# Patient Record
Sex: Male | Born: 1970 | Hispanic: Yes | Marital: Married | Smoking: Never smoker
Health system: Southern US, Community
[De-identification: ages and names within clinical notes are randomized; demographics above are authoritative.]

## PROBLEM LIST (undated history)

## (undated) DIAGNOSIS — E7800 Pure hypercholesterolemia, unspecified: Secondary | ICD-10-CM

## (undated) HISTORY — PX: NO PAST SURGERIES: SHX2092

---

## 2006-07-07 ENCOUNTER — Other Ambulatory Visit: Payer: Self-pay

## 2006-07-07 ENCOUNTER — Emergency Department: Payer: Self-pay | Admitting: Emergency Medicine

## 2007-06-01 ENCOUNTER — Emergency Department: Payer: Self-pay | Admitting: Emergency Medicine

## 2012-02-11 ENCOUNTER — Ambulatory Visit: Payer: Self-pay | Admitting: Emergency Medicine

## 2014-03-17 ENCOUNTER — Ambulatory Visit: Payer: Self-pay | Admitting: Physician Assistant

## 2014-12-13 ENCOUNTER — Encounter: Payer: Self-pay | Admitting: Emergency Medicine

## 2014-12-13 ENCOUNTER — Ambulatory Visit
Admission: EM | Admit: 2014-12-13 | Discharge: 2014-12-13 | Disposition: A | Discharge status: Home / Self Care | Payer: Self-pay | Attending: Family Medicine | Admitting: Family Medicine

## 2014-12-13 ENCOUNTER — Ambulatory Visit: Payer: Self-pay

## 2014-12-13 DIAGNOSIS — J069 Acute upper respiratory infection, unspecified: Secondary | ICD-10-CM

## 2014-12-13 HISTORY — DX: Pure hypercholesterolemia, unspecified: E78.00

## 2014-12-13 MED ORDER — AZITHROMYCIN 250 MG PO TABS: ORAL_TABLET | ORAL | Status: DC

## 2014-12-13 NOTE — ED Provider Notes (Signed)
CSN: [id number]     Arrival date & time 12/13/14  1043 History   First MD Initiated Contact with Patient 12/13/14 1219     Chief Complaint  Patient presents with  . Sore Throat   (Consider location/radiation/quality/duration/timing/severity/associated sxs/prior Treatment) HPI   This 44 year old Hispanic gentleman who presents with a four-day history of a sore throat and body aches chills nasal congestion and a cough. His had chills and feeling of hot and cold but has not actually taken his temperature. Today he is afebrile in the office. His O2 sat is 97% and he is a nonsmoker never has smoked. He had some left over amoxicillin and started taking that Friday night into Saturday morning for a total of 3 doses which she thought helped somewhat but he did not have enough medicine on hand to complete a course.  Past Medical History  Diagnosis Date  . Hypercholesteremia    History reviewed. No pertinent past surgical history. Family History  Problem Relation Age of Onset  . Hyperlipidemia Father    Social History  Substance Use Topics  . Smoking status: Never Smoker   . Smokeless tobacco: None  . Alcohol Use: No    Review of Systems  Constitutional: Positive for fever and chills.  HENT: Positive for congestion, postnasal drip, rhinorrhea, sinus pressure and sore throat. Negative for ear discharge and ear pain.   Respiratory: Positive for cough, shortness of breath and wheezing.   All other systems reviewed and are negative.   Allergies  Review of patient's allergies indicates no known allergies.  Home Medications   Prior to Admission medications   Medication Sig Start Date End Date Taking? Authorizing Provider  azithromycin (ZITHROMAX Z-PAK) 250 MG tablet Use as per package instructions 12/13/14   Lutricia FeilWilliam P Roemer, PA-C   Meds Ordered and Administered this Visit  Medications - No data to display  BP 116/75 mmHg  Pulse 85  Temp(Src) 97 F (36.1 C) (Tympanic)  Resp 18  Ht  5\' 7"  (1.702 m)  Wt 210 lb (95.255 kg)  BMI 32.88 kg/m2  SpO2 97% No data found.   Physical Exam  Constitutional: He is oriented to person, place, and time. He appears well-developed and well-nourished. No distress.  HENT:  Head: Normocephalic and atraumatic.  Right Ear: External ear normal.  Left Ear: External ear normal.  Nose: Nose normal.  Mouth/Throat: Oropharynx is clear and moist.  Eyes: EOM are normal. Pupils are equal, round, and reactive to light. Right eye exhibits no discharge. Left eye exhibits no discharge.  Neck: Neck supple.  Pulmonary/Chest: No respiratory distress. He has wheezes. He has rales.  Musculoskeletal: Normal range of motion. He exhibits no edema or tenderness.  Lymphadenopathy:    He has no cervical adenopathy.  Neurological: He is alert and oriented to person, place, and time.  Skin: Skin is warm and dry. He is not diaphoretic.  Psychiatric: He has a normal mood and affect. His behavior is normal. Judgment and thought content normal.  Nursing note and vitals reviewed.   ED Course  Procedures (including critical care time)  Labs Review Labs Reviewed - No data to display  Imaging Review Dg Chest 2 View  12/13/2014  CLINICAL DATA:  right lower lobe crackles productive cough for 4 days EXAM: CHEST  2 VIEW COMPARISON:  None. FINDINGS: Cardiomediastinal silhouette is unremarkable. No acute infiltrate or pleural effusion. No pulmonary edema. Bony thorax is unremarkable. IMPRESSION: No active cardiopulmonary disease. Electronically Signed   By: Lang SnowLiviu  Pop M.D.   On: 12/13/2014 13:17     Visual Acuity Review  Right Eye Distance:   Left Eye Distance:   Bilateral Distance:    Right Eye Near:   Left Eye Near:    Bilateral Near:         MDM   1. Acute URI    Discharge Medication List as of 12/13/2014  1:56 PM    START taking these medications   Details  azithromycin (ZITHROMAX Z-PAK) 250 MG tablet Use as per package instructions, Print       Plan: 1. Test/x-ray results and diagnosis reviewed with patient 2. rx as per orders; risks, benefits, potential side effects reviewed with patient 3. Recommend supportive treatment with fluids,rest 4. F/u prn if symptoms worsen or don't improve  Discussion with the patient and his wife who was present with him. I told him that this is possibly a viral infection however because of his crackles and wheezing his low O2 sat and his productive cough I felt the need in prescribing a azithromax dose pack at this point time. He understands that the cough may persist for  Weeks despite treatment. I've encouraged him to increase fluids and rest as much as possible. He is Not coughing at nighttime and not much during the daytime unless he becomes chilled so I did not prescribe any cough remedies. To follow-up in our clinic if there is any interval worsening of his symptoms.   Lutricia Feil, PA-C 12/13/14 1408

## 2014-12-13 NOTE — ED Notes (Signed)
Patient developed a sore throat over the weekend and states he took some amoxicillin he had and thought it helped. Today he has had body aches,& chills

## 2015-04-07 ENCOUNTER — Encounter: Payer: Self-pay | Admitting: *Deleted

## 2015-04-07 ENCOUNTER — Ambulatory Visit
Admission: EM | Admit: 2015-04-07 | Discharge: 2015-04-07 | Disposition: A | Discharge status: Home / Self Care | Payer: Self-pay | Attending: Family Medicine | Admitting: Family Medicine

## 2015-04-07 DIAGNOSIS — B349 Viral infection, unspecified: Secondary | ICD-10-CM

## 2015-04-07 LAB — RAPID STREP SCREEN (MED CTR MEBANE ONLY): Streptococcus, Group A Screen (Direct): NEGATIVE

## 2015-04-07 MED ORDER — ACETAMINOPHEN 500 MG PO TABS
1000.0000 mg | ORAL_TABLET | Freq: Once | ORAL | Status: AC
  Administered 2015-04-07: 1000 mg via ORAL

## 2015-04-07 MED ORDER — OSELTAMIVIR PHOSPHATE 75 MG PO CAPS: 75.0000 mg | ORAL_CAPSULE | Freq: Two times a day (BID) | ORAL | Status: DC

## 2015-04-07 NOTE — Discharge Instructions (Signed)
Take medication as prescribed. Rest. Drink plenty of fluids. Take over the counter tylenol or ibuprofen as needed for pain or fever.  ° °Follow up with your primary care physician this week as needed. Return to Urgent care for new or worsening concerns.  ° °Viral Infections °A viral infection can be caused by different types of viruses. Most viral infections are not serious and resolve on their own. However, some infections may cause severe symptoms and may lead to further complications. °SYMPTOMS °Viruses can frequently cause: °· Minor sore throat. °· Aches and pains. °· Headaches. °· Runny nose. °· Different types of rashes. °· Watery eyes. °· Tiredness. °· Cough. °· Loss of appetite. °· Gastrointestinal infections, resulting in nausea, vomiting, and diarrhea. °These symptoms do not respond to antibiotics because the infection is not caused by bacteria. However, you might catch a bacterial infection following the viral infection. This is sometimes called a "superinfection." Symptoms of such a bacterial infection may include: °· Worsening sore throat with pus and difficulty swallowing. °· Swollen neck glands. °· Chills and a high or persistent fever. °· Severe headache. °· Tenderness over the sinuses. °· Persistent overall ill feeling (malaise), muscle aches, and tiredness (fatigue). °· Persistent cough. °· Yellow, green, or brown mucus production with coughing. °HOME CARE INSTRUCTIONS  °· Only take over-the-counter or prescription medicines for pain, discomfort, diarrhea, or fever as directed by your caregiver. °· Drink enough water and fluids to keep your urine clear or pale yellow. Sports drinks can provide valuable electrolytes, sugars, and hydration. °· Get plenty of rest and maintain proper nutrition. Soups and broths with crackers or rice are fine. °SEEK IMMEDIATE MEDICAL CARE IF:  °· You have severe headaches, shortness of breath, chest pain, neck pain, or an unusual rash. °· You have uncontrolled vomiting,  diarrhea, or you are unable to keep down fluids. °· You or your child has an oral temperature above 102° F (38.9° C), not controlled by medicine. °· Your baby is older than 3 months with a rectal temperature of 102° F (38.9° C) or higher. °· Your baby is 3 months old or younger with a rectal temperature of 100.4° F (38° C) or higher. °MAKE SURE YOU:  °· Understand these instructions. °· Will watch your condition. °· Will get help right away if you are not doing well or get worse. °  °This information is not intended to replace advice given to you by your health care provider. Make sure you discuss any questions you have with your health care provider. °  °Document Released: 11/07/2004 Document Revised: 04/22/2011 Document Reviewed: 07/06/2014 °Elsevier Interactive Patient Education ©2016 [employer] ° °

## 2015-04-07 NOTE — ED Notes (Signed)
Patient started having symptoms of sore throat, nasal congestion, and fever yesterday. Symptom persist today.

## 2015-04-07 NOTE — ED Provider Notes (Signed)
[facility]  ____________________________________________  Time seen: Approximately 5:11 PM  I have reviewed the triage vital signs and the nursing notes.   HISTORY  Chief Complaint Sore Throat; Fever; and Nasal Congestion   HPI Jeremiah Burke is a 45 y.o. male presents for the complaints of 1 day history of runny nose, nasal congestion, sore throat, cough and body aches. Reports felt like a fever but did not measure it. Reports continues to eat and drink well. Reports son with similar symptoms at the onset of same time. States that he ended up taking assigned to the ER last night and was swab positive for influenza. Reports symptoms unresolved with over-the-counter cough and congestion medications. Reports took Advil fevers prior to arrival.  Denies chest pain, shortness breath, dizziness, weakness, nausea, vomiting or diarrhea.   Past Medical History  Diagnosis Date  . Hypercholesteremia     There are no active problems to display for this patient.   History reviewed. No pertinent past surgical history.  Current Outpatient Rx  Name  Route  Sig  Dispense  Refill  .             Allergies Review of patient's allergies indicates no known allergies.  Family History  Problem Relation Age of Onset  . Hyperlipidemia Father     Social History Social History  Substance Use Topics  . Smoking status: Never Smoker   . Smokeless tobacco: None  . Alcohol Use: No    Review of Systems Constitutional: Subjective fevers. Eyes: No visual changes. ENT: Positive runny nose, nasal congestion, sore throat and cough. Cardiovascular: Denies chest pain. Respiratory: Denies shortness of breath. Gastrointestinal: No abdominal pain.  No nausea, no vomiting.  No diarrhea.  No constipation. Genitourinary: Negative for dysuria. Musculoskeletal: Negative for back pain. Skin: Negative for rash. Neurological: Negative for headaches, focal weakness or numbness.  10-point ROS  otherwise negative.  ____________________________________________   PHYSICAL EXAM:  VITAL SIGNS: ED Triage Vitals  Enc Vitals Group     BP 04/07/15 1619 137/88 mmHg     Pulse Rate 04/07/15 1619 117 Recheck 94     Resp 04/07/15 1619 18     Temp 04/07/15 1619 98 F (36.7 C)     Temp Source 04/07/15 1619 Oral     SpO2 04/07/15 1619 98 %     Weight 04/07/15 1619 210 lb (95.255 kg)     Height 04/07/15 1619  (1.727 m)     Head Cir --      Peak Flow --      Pain Score 04/07/15 1622 0     Pain Loc --      Pain Edu? --      Excl. in GC? --     Constitutional: Alert and oriented. Well appearing and in no acute distress. Eyes: Conjunctivae are normal. PERRL. EOMI. Head: Atraumatic. No sinus tenderness to palpation. No swelling. No erythema.  Ears: no erythema, normal TMs bilaterally.   Nose: Nasal congestion with clear rhinorrhea.  Mouth/Throat: Mucous membranes are moist.  Mild pharyngeal erythema. No tonsillar swelling or exudate. Neck: No stridor.  No cervical spine tenderness to palpation. Hematological/Lymphatic/Immunilogical: No cervical lymphadenopathy. Cardiovascular: Normal rate, regular rhythm. Grossly normal heart sounds.  Good peripheral circulation. Respiratory: Normal respiratory effort.  No retractions. Lungs CTAB. No wheezes, rales or rhonchi. Good air movement. Gastrointestinal: Soft and nontender.  No CVA tenderness. Musculoskeletal: No lower or upper extremity tenderness nor edema. No cervical, thoracic or lumbar tenderness patient. Neurologic:  Normal speech and language. No gross focal neurologic deficits are appreciated. No gait instability. Skin:  Skin is warm, dry and intact. No rash noted. Psychiatric: Mood and affect are normal. Speech and behavior are normal.  ____________________________________________   LABS (all labs ordered are listed, but only abnormal results are displayed)  Labs Reviewed  RAPID STREP SCREEN (NOT AT [facility] [place])  CULTURE, GROUP  A STREP [facility])   INITIAL IMPRESSION / ASSESSMENT AND PLAN / ED COURSE  Pertinent labs & imaging results that were available during my care of the patient were reviewed by me and considered in my medical decision making (see chart for details).  Very well-appearing patient. No acute distress. Presents with complaints of 1 day history of runny nose, sinus congestion, sore throat and cough. Reports body aches and chills and felt he had a fever. Reports son with sinus symptoms onset same timeframe and diagnosed with flu swab positive in ER last night. Lungs clear throughout. Abdomen soft and nontender. Suspect viral illness such as influenza. Will evaluate for strep to rule out strep throat.  Quick strep negative, will culture. Discussed with patient as with positive exposure to influenza in household as well as clinical appearance suspect influenza. Discussed with patient whether to evaluate by swab or not, patient states that he is happy to not have swab done and be treated for flu. Will treat supportively with oral Tamiflu, encourage rest, fluids, over-the-counter Tylenol or ibuprofen as needed. Follow with PCP as needed. Work note given for today and tomorrow.  Discussed follow up with Primary care physician this week. Discussed follow up and return parameters including no resolution or any worsening concerns. Patient verbalized understanding and agreed to plan.   ____________________________________________   FINAL CLINICAL IMPRESSION(S) / ED DIAGNOSES  Final diagnoses:  Viral illness      Note: This dictation was prepared with Dragon dictation along with smaller phrase technology. Any transcriptional errors that result from this process are unintentional.    Renford Dills, NP 04/07/15 1913

## 2015-04-10 LAB — CULTURE, GROUP A STREP (THRC)

## 2016-09-06 IMAGING — CR DG CHEST 2V
2 series · 2 of 2 positions shown · non-contrast
Comparison: None.

CLINICAL DATA: right lower lobe crackles productive cough for 4
days

EXAM:
CHEST  2 VIEW

[chest pa]
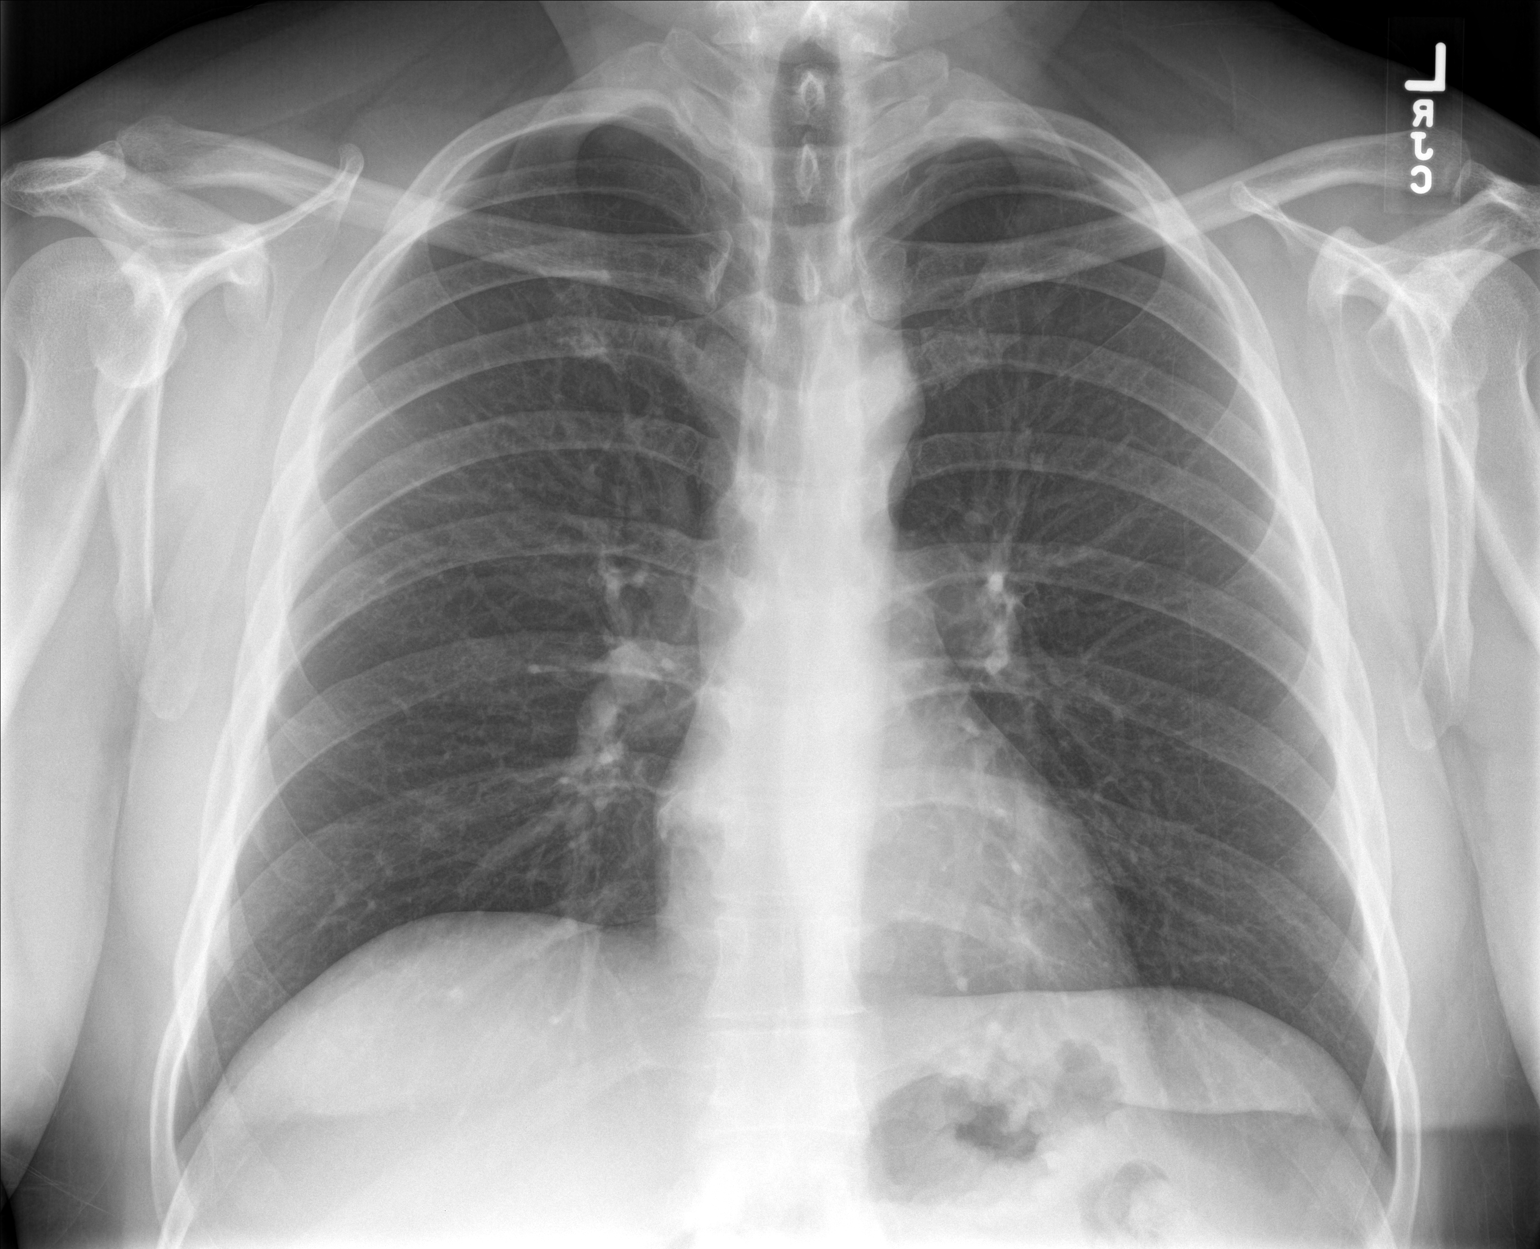

[chest lat]
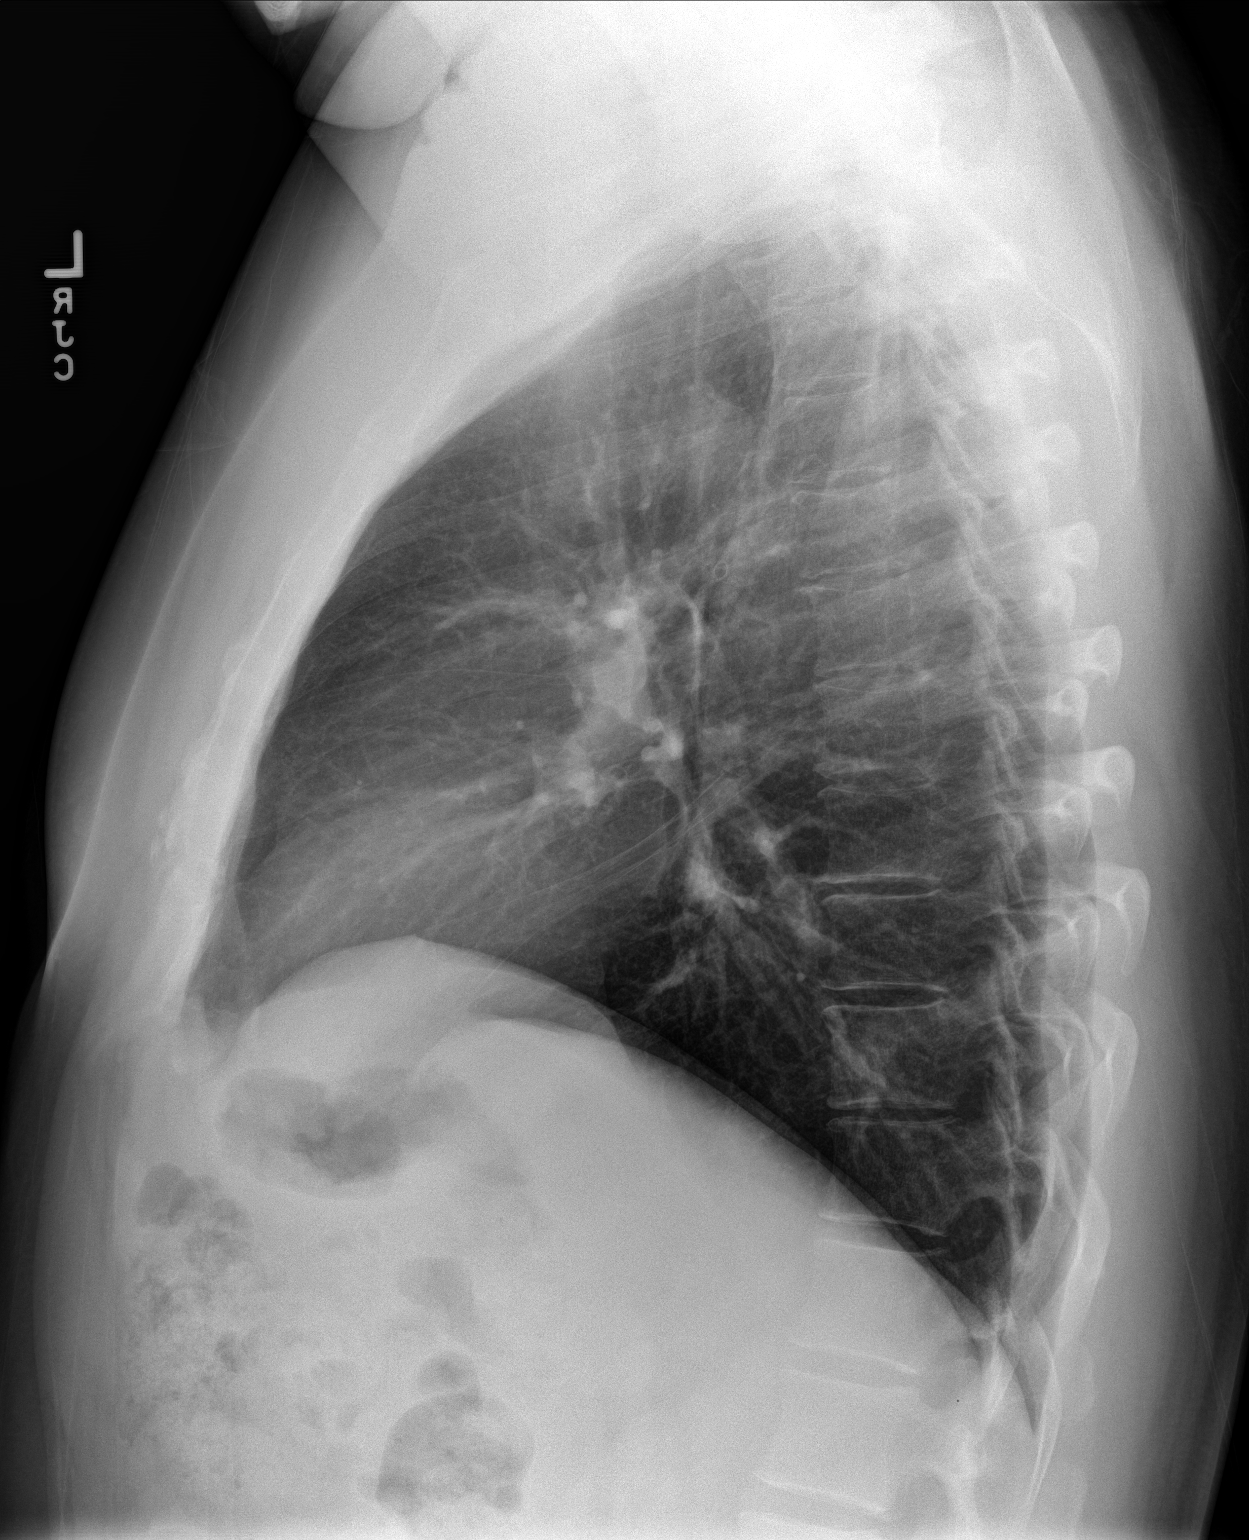

[2 of 2 positions shown; findings below may reference images not displayed]

FINDINGS: Cardiomediastinal silhouette is unremarkable. No acute infiltrate or
pleural effusion. No pulmonary edema. Bony thorax is unremarkable.
IMPRESSION: No active cardiopulmonary disease.

## 2019-06-19 ENCOUNTER — Ambulatory Visit: Payer: Self-pay | Attending: Internal Medicine

## 2019-06-19 DIAGNOSIS — Z23 Encounter for immunization: Secondary | ICD-10-CM

## 2019-06-19 NOTE — Progress Notes (Signed)
   Covid-19 Vaccination Clinic  Name:  Jeremiah Burke    MRN: [id number] DOB: 1970-09-18  06/19/2019  Jeremiah Burke was observed post Covid-19 immunization for 15 minutes without incident. He was provided with Vaccine Information Sheet and instruction to access the V-Safe system.   Jeremiah Burke was instructed to call 911 with any severe reactions post vaccine: Marland Kitchen Difficulty breathing  . Swelling of face and throat  . A fast heartbeat  . A bad rash all over body  . Dizziness and weakness   Immunizations Administered    Name Date Dose VIS Date Route   Pfizer COVID-19 Vaccine 06/19/2019 11:54 AM 0.3 mL 04/07/2018 Intramuscular   Manufacturer: [employer], [employer]   Lot: [id number]   NDC: [address]-3

## 2019-07-13 ENCOUNTER — Ambulatory Visit: Payer: Self-pay | Attending: Internal Medicine

## 2019-07-13 DIAGNOSIS — Z23 Encounter for immunization: Secondary | ICD-10-CM

## 2019-07-13 NOTE — Progress Notes (Signed)
   Covid-19 Vaccination Clinic  Name:  Toua Stites    MRN: [id number] DOB: Sep 18, 1970  07/13/2019  Mr. Oscar was observed post Covid-19 immunization for 15 minutes without incident. He was provided with Vaccine Information Sheet and instruction to access the V-Safe system.   Mr. Rutigliano was instructed to call 911 with any severe reactions post vaccine: Marland Kitchen Difficulty breathing  . Swelling of face and throat  . A fast heartbeat  . A bad rash all over body  . Dizziness and weakness   Immunizations Administered    Name Date Dose VIS Date Route   Pfizer COVID-19 Vaccine 07/13/2019  4:20 PM 0.3 mL 04/07/2018 Intramuscular   Manufacturer: [employer], [employer]   Lot: [id number]   NDC: [address]-7

## 2019-10-20 ENCOUNTER — Ambulatory Visit
Admission: EM | Admit: 2019-10-20 | Discharge: 2019-10-20 | Disposition: A | Discharge status: Home / Self Care | Payer: Self-pay | Attending: Physician Assistant | Admitting: Physician Assistant

## 2019-10-20 ENCOUNTER — Encounter: Payer: Self-pay | Admitting: Emergency Medicine

## 2019-10-20 ENCOUNTER — Other Ambulatory Visit: Payer: Self-pay

## 2019-10-20 DIAGNOSIS — K219 Gastro-esophageal reflux disease without esophagitis: Secondary | ICD-10-CM

## 2019-10-20 DIAGNOSIS — R1010 Upper abdominal pain, unspecified: Secondary | ICD-10-CM

## 2019-10-20 MED ORDER — OMEPRAZOLE 20 MG PO CPDR
20.0000 mg | DELAYED_RELEASE_CAPSULE | Freq: Two times a day (BID) | ORAL | 0 refills | Status: AC
Start: 2019-10-20 — End: 2019-11-19

## 2019-10-20 MED ORDER — SUCRALFATE 1 GM/10ML PO SUSP: 1.0000 g | Freq: Three times a day (TID) | ORAL | 1 refills | Status: AC

## 2019-10-20 NOTE — ED Provider Notes (Signed)
[facility]    CSN: [id number] Arrival date & time: 10/20/19  1651      History   Chief Complaint Chief Complaint  Patient presents with  . Gastroesophageal Reflux    HPI Jeremiah Burke is a 49 y.o. male.   49 year old male presents for 1 week history of scratchy throat, burning esophageal pain, and epigastric pain.  He says pain is worsened with spicy foods or greasy foods.  Pain is also worsened when he drinks caffeine or sodas.  He has a history of acid reflux.  He has been taking over-the-counter omeprazole 20 mg a day without much relief.  He says he has not tried anything else.  He has been trying to change the way he eats and avoid trigger foods.  Patient denies any worsening of symptoms but says they have not really gotten too much better.  He denies any fever, nausea or vomiting, diarrhea or constipation.  He denies any pain that radiates to the back.  He denies chest pain or shortness of breath.  He denies dark stools, blood in the stool or hematemesis.  He has no other concerns today.     Past Medical History:  Diagnosis Date  . Hypercholesteremia     There are no problems to display for this patient.   Past Surgical History:  Procedure Laterality Date  . NO PAST SURGERIES         Home Medications    Prior to Admission medications   Medication Sig Start Date End Date Taking? Authorizing Provider  azithromycin (ZITHROMAX Z-PAK) 250 MG tablet Use as per package instructions 12/13/14   Lutricia Feil, PA-C  omeprazole (PRILOSEC) 20 MG capsule Take 1 capsule (20 mg total) by mouth 2 (two) times daily before a meal. 10/20/19 11/19/19  Shirlee Latch, PA-C  oseltamivir (TAMIFLU) 75 MG capsule Take 1 capsule (75 mg total) by mouth every 12 (twelve) hours. 04/07/15   Renford Dills, NP  sucralfate (CARAFATE) 1 GM/10ML suspension Take 10 mLs (1 g total) by mouth 4 (four) times daily -  with meals and at bedtime. 10/20/19 11/19/19  Shirlee Latch, PA-C     Family History Family History  Problem Relation Age of Onset  . Hyperlipidemia Father   . GER disease Mother     Social History Social History   Tobacco Use  . Smoking status: Never Smoker  . Smokeless tobacco: Never Used  Vaping Use  . Vaping Use: Never used  Substance Use Topics  . Alcohol use: No  . Drug use: Never     Allergies   Patient has no known allergies.   Review of Systems Review of Systems  Constitutional: Negative for appetite change, chills, diaphoresis, fatigue and fever.  HENT: Negative for sore throat and trouble swallowing.   Respiratory: Negative for cough and shortness of breath.   Cardiovascular: Negative for chest pain.  Gastrointestinal: Positive for abdominal pain. Negative for abdominal distention, blood in stool, constipation, diarrhea, nausea and vomiting.  Endocrine: Negative for polyuria.  Genitourinary: Negative for discharge and dysuria.  Musculoskeletal: Negative for back pain and myalgias.  Skin: Negative for rash.  Neurological: Negative for dizziness, weakness and headaches.     Physical Exam Triage Vital Signs ED Triage Vitals [10/20/19 1707]  Enc Vitals Group     BP 128/90     Pulse Rate 94     Resp 18     Temp 98.6 F (37 C)     Temp Source  Oral     SpO2 98 %     Weight 215 lb (97.5 kg)     Height 5\' 8"  (1.727 m)     Head Circumference      Peak Flow      Pain Score 0     Pain Loc      Pain Edu?      Excl. in GC?    No data found.  Updated Vital Signs BP 128/90 (BP Location: Left Arm)   Pulse 94   Temp 98.6 F (37 C) (Oral)   Resp 18   Ht 5\' 8"  (1.727 m)   Wt 215 lb (97.5 kg)   SpO2 98%   BMI 32.69 kg/m       Physical Exam Vitals and nursing note reviewed.  Constitutional:      General: He is not in acute distress.    Appearance: Normal appearance. He is well-developed. He is obese. He is not toxic-appearing.  HENT:     Head: Normocephalic and atraumatic.     Mouth/Throat:     Mouth:  Mucous membranes are moist.     Pharynx: Oropharynx is clear.  Eyes:     General: No scleral icterus.    Conjunctiva/sclera: Conjunctivae normal.  Cardiovascular:     Rate and Rhythm: Normal rate and regular rhythm.     Heart sounds: No murmur heard.   Pulmonary:     Effort: Pulmonary effort is normal. No respiratory distress.     Breath sounds: Normal breath sounds.  Abdominal:     General: Bowel sounds are normal.     Palpations: Abdomen is soft.     Tenderness: There is abdominal tenderness (mild TTP epigastric and LUQ). There is no right CVA tenderness, left CVA tenderness, guarding or rebound.  Musculoskeletal:     Cervical back: Neck supple.  Skin:    General: Skin is warm and dry.  Neurological:     General: No focal deficit present.     Mental Status: He is alert. Mental status is at baseline.     Motor: No weakness.     Gait: Gait normal.  Psychiatric:        Mood and Affect: Mood normal.        Behavior: Behavior normal.        Thought Content: Thought content normal.      UC Treatments / Results  Labs (all labs ordered are listed, but only abnormal results are displayed) Labs Reviewed - No data to display  EKG   Radiology No results found.  Procedures Procedures (including critical care time)  Medications Ordered in UC Medications - No data to display  Initial Impression / Assessment and Plan / UC Course  I have reviewed the triage vital signs and the nursing notes.  Pertinent labs & imaging results that were available during my care of the patient were reviewed by me and considered in my medical decision making (see chart for details).   Patient symptoms and exam consistent with GERD.  Increase Prilosec to 20 mg twice a day and also prescribed Carafate.  Discussed avoiding trigger foods.  Eating smaller meals.  Not eating before bedtime.  Advised increasing fluid intake.  Advised if this treatment does not work he should contact [facility] for  further work-up including possible endoscopy.  For any emergent symptoms that arise such as fever, severe pain, emesis, hematemesis, dark stools, or weakness he should go to ED.  Final Clinical Impressions(s) / UC  Diagnoses   Final diagnoses:  Gastroesophageal reflux disease, unspecified whether esophagitis present  Upper abdominal pain     Discharge Instructions     Start omeprazole 20 mg twice a day and Carafate.  Avoid medications to make your reflux worse such as caffeine, soda, spicy or greasy foods.  Eat smaller meals and do not eat 2 hours before bedtime.  Follow-up with GI if your symptoms are persistent after the next couple of weeks.  Go to ER if you have any severe abdominal pain or pain that goes through to the back, or you have dark stools, blood in the stools or are vomiting.  [facility] (Gastroenterology) Phone: [phone] Please contact the following number for appointment with GI     ED Prescriptions    Medication Sig Dispense Auth. Provider   omeprazole (PRILOSEC) 20 MG capsule Take 1 capsule (20 mg total) by mouth 2 (two) times daily before a meal. 60 capsule Eusebio Friendly B, PA-C   sucralfate (CARAFATE) 1 GM/10ML suspension Take 10 mLs (1 g total) by mouth 4 (four) times daily -  with meals and at bedtime. 430 mL Shirlee Latch, PA-C     PDMP not reviewed this encounter.   Shirlee Latch, PA-C 10/21/19 [phone]

## 2019-10-20 NOTE — Discharge Instructions (Addendum)
Start omeprazole 20 mg twice a day and Carafate.  Avoid medications to make your reflux worse such as caffeine, soda, spicy or greasy foods.  Eat smaller meals and do not eat 2 hours before bedtime.  Follow-up with GI if your symptoms are persistent after the next couple of weeks.  Go to ER if you have any severe abdominal pain or pain that goes through to the back, or you have dark stools, blood in the stools or are vomiting.  [facility] (Gastroenterology) Phone: [phone] Please contact the following number for appointment with GI

## 2019-10-20 NOTE — ED Triage Notes (Signed)
Patient in today c/o acid reflux x 1 week. Patient has been taking OTC Omeprazole 20mg  daily without much relief.

## 2021-12-12 ENCOUNTER — Ambulatory Visit (INDEPENDENT_AMBULATORY_CARE_PROVIDER_SITE_OTHER): Payer: Self-pay

## 2021-12-12 ENCOUNTER — Ambulatory Visit
Admission: EM | Admit: 2021-12-12 | Discharge: 2021-12-12 | Disposition: A | Discharge status: Home / Self Care | Payer: Self-pay | Attending: Family Medicine | Admitting: Family Medicine

## 2021-12-12 DIAGNOSIS — M5030 Other cervical disc degeneration, unspecified cervical region: Secondary | ICD-10-CM

## 2021-12-12 DIAGNOSIS — M542 Cervicalgia: Secondary | ICD-10-CM

## 2021-12-12 DIAGNOSIS — S161XXA Strain of muscle, fascia and tendon at neck level, initial encounter: Secondary | ICD-10-CM

## 2021-12-12 DIAGNOSIS — M25511 Pain in right shoulder: Secondary | ICD-10-CM

## 2021-12-12 MED ORDER — KETOROLAC TROMETHAMINE 60 MG/2ML IM SOLN
30.0000 mg | Freq: Once | INTRAMUSCULAR | Status: AC
  Administered 2021-12-12: 30 mg via INTRAMUSCULAR

## 2021-12-12 MED ORDER — TIZANIDINE HCL 4 MG PO TABS: 4.0000 mg | ORAL_TABLET | Freq: Four times a day (QID) | ORAL | 0 refills | Status: AC | PRN

## 2021-12-12 MED ORDER — NAPROXEN 500 MG PO TABS: 500.0000 mg | ORAL_TABLET | Freq: Two times a day (BID) | ORAL | 0 refills | Status: AC

## 2021-12-12 NOTE — ED Provider Notes (Signed)
[facility]    CSN: [id number] Arrival date & time: 12/12/21  1521      History   Chief Complaint Chief Complaint  Patient presents with   Neck Pain   Shoulder Pain    HPI  HPI Jeremiah Burke is a 51 y.o. male.   Jeremiah Burke presents for right shoulder and right-sided neck pain.  Last Wednesday he was taken up towel in his home. Reports delayed pain in his shoulder until the next day.  On Friday, he helped her friend get rid of some leaves with a air blower.  He noticed that his pain got worse.  He has been taking ibuprofen 800 mg 3 times a day without relief of his pain.  Did not hear any pop or abnormal sounds with his injury.  Continues to have throbbing pain when moving his arm around.  Pain is worse at night. He is right-handed.  Denies previous injury to this arm or neck.        Past Medical History:  Diagnosis Date   Hypercholesteremia     There are no problems to display for this patient.   Past Surgical History:  Procedure Laterality Date   NO PAST SURGERIES         Home Medications    Prior to Admission medications   Medication Sig Start Date End Date Taking? Authorizing Provider  naproxen (NAPROSYN) 500 MG tablet Take 1 tablet (500 mg total) by mouth 2 (two) times daily with a meal. 12/13/21  Yes Laretta Pyatt, DO  tiZANidine (ZANAFLEX) 4 MG tablet Take 1 tablet (4 mg total) by mouth every 6 (six) hours as needed for muscle spasms. 12/12/21  Yes Caroline Matters, Seward Meth, DO  omeprazole (PRILOSEC) 20 MG capsule Take 1 capsule (20 mg total) by mouth 2 (two) times daily before a meal. 10/20/19 11/19/19  Eusebio Friendly B, PA-C  sucralfate (CARAFATE) 1 GM/10ML suspension Take 10 mLs (1 g total) by mouth 4 (four) times daily -  with meals and at bedtime. 10/20/19 11/19/19  Shirlee Latch, PA-C    Family History Family History  Problem Relation Age of Onset   Hyperlipidemia Father    GER disease Mother     Social History Social History   Tobacco Use    Smoking status: Never   Smokeless tobacco: Never  Vaping Use   Vaping Use: Never used  Substance Use Topics   Alcohol use: No   Drug use: Never     Allergies   Patient has no known allergies.   Review of Systems Review of Systems: :negative unless otherwise stated in HPI.      Physical Exam Triage Vital Signs ED Triage Vitals  Enc Vitals Group     BP 12/12/21 1552 138/82     Pulse Rate 12/12/21 1552 64     Resp --      Temp 12/12/21 1552 98 F (36.7 C)     Temp Source 12/12/21 1552 Oral     SpO2 12/12/21 1552 99 %     Weight 12/12/21 1551 215 lb (97.5 kg)     Height 12/12/21 1551 5\' 8"  (1.727 m)     Head Circumference --      Peak Flow --      Pain Score 12/12/21 1551 8     Pain Loc --      Pain Edu? --      Excl. in GC? --    No data found.  Updated Vital Signs BP  138/82 (BP Location: Left Arm)   Pulse 64   Temp 98 F (36.7 C) (Oral)   Ht 5\' 8"  (1.727 m)   Wt 97.5 kg   SpO2 99%   BMI 32.69 kg/m   Visual Acuity Right Eye Distance:   Left Eye Distance:   Bilateral Distance:    Right Eye Near:   Left Eye Near:    Bilateral Near:     Physical Exam GEN: well appearing male in no acute distress  NECK: normal ROM, + paraspinal tenderness, + lower C-spine (C6/C7) midline tenderness CVS: well perfused, radial pulses palpable bilaterally RESP: speaking in full sentences without pause, no respiratory distress  MSK: right shoulder:  No evidence of bony deformity, asymmetry, or muscle atrophy. No tenderness over long head of biceps (bicipital groove).  No TTP at [facility] joint.  Full active and passive (ABD, ADD, Flexion, extension, IR, ER). Strength 5/5 grip, elbow. No abnormal scapular function observed.  Special Tests: Hawkins: positive right; Empty Can: positive right, Neer's: Negative; Painful arc: Negative; Anterior Apprehension: Negative Sensation intact. Peripheral pulses intact.    UC Treatments / Results  Labs (all labs ordered are listed, but  only abnormal results are displayed) Labs Reviewed - No data to display  EKG   Radiology DG Cervical Spine Complete  Result Date: 12/12/2021 CLINICAL DATA:  Neck pain. EXAM: CERVICAL SPINE - COMPLETE 4+ VIEW COMPARISON:  None Available. FINDINGS: The cervical vertebral bodies are normally aligned. No acute cervical spine fracture. There is straightening of the normal cervical lordosis which could be seen with pain, muscle spasm or positioning. Moderate age advanced facet disease. This in combination with uncinate spurring contributes to mild bony foraminal narrowing at C3-4 and C4-5. The C1-2 articulations are maintained. The lung apices are clear. IMPRESSION: 1. 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 2. No acute bony findings. 3. Mild bony foraminal narrowing at C3-4 and C4-5. Electronically Signed   By: 13/02/2021 M.D.   On: 12/12/2021 16:42   DG Shoulder Right  Result Date: 12/12/2021 CLINICAL DATA:  Right shoulder pain. EXAM: RIGHT SHOULDER - 2+ VIEW COMPARISON:  None Available. FINDINGS: The glenohumeral and AC joints are maintained. No acute bony findings or degenerative changes. No abnormal soft tissue calcifications. The right ribs are intact and the right lung is clear. IMPRESSION: Normal right shoulder radiographs. Electronically Signed   By: 13/02/2021 M.D.   On: 12/12/2021 16:39    Procedures Procedures (including critical care time)  Medications Ordered in UC Medications  ketorolac (TORADOL) injection 30 mg (30 mg Intramuscular Given 12/12/21 1654)    Initial Impression / Assessment and Plan / UC Course  I have reviewed the triage vital signs and the nursing notes.  Pertinent labs & imaging results that were available during my care of the patient were reviewed by me and considered in my medical decision making (see chart for details).        Pt is a 51 y.o.  male with 1 week of right shoulder pain and neck after overuse  injury at home. Obtained cervical and right shoulder films. Toradol given for pain with improvement. Personally reviewed by me were unremarkable for significant malalignment, fracture or dislocation.  Radiologist notes advanced cervical degenerative disc disease C3-5. Exam also concerning for possible rotator cuff/supraspinatus tendinitis versus tear.   Patient to gradually return to normal activities, as tolerated and continue ordinary activities within the limits permitted by pain. Prescribed Naproxen sodium  and muscle relaxer   for pain relief.  Tylenol PRN. Advised patient to avoid other NSAIDs while taking  Naprosyn. Counseled patient on red flag symptoms and when to seek immediate care.   No red flags such as progressive major motor weakness.   Patient to follow up with orthopedic provider for possible rotator cuff injury. Return and ED precautions given.   Discussed MDM, treatment plan and plan for follow-up with patient/parent who agrees with plan.   Final Clinical Impressions(s) / UC Diagnoses   Final diagnoses:  Acute pain of right shoulder  Acute strain of neck muscle, initial encounter  DDD (degenerative disc disease), cervical     Discharge Instructions      Stop by the pharmacy to pick up your prescriptions (Naprosyn for pain and Tizanidine is a muscle relaxer).  Take Tylenol at 1000 mg 3 times a day as needed for additional pain relief.  Consider purchasing some lidocaine patches and apply for 12 hours.  After 12 hours remove patch and reapply once a day.  See handout on cervical strain rehab exercises.  I am concerned you may have injured one of your rotator cuff muscles or tendons. Follow up with [facility] in [place] or [place].      ED Prescriptions     Medication Sig Dispense Auth. Provider   tiZANidine (ZANAFLEX) 4 MG tablet Take 1 tablet (4 mg total) by mouth every 6 (six) hours as needed for muscle spasms. 30 tablet Algis Lehenbauer, DO   naproxen  (NAPROSYN) 500 MG tablet Take 1 tablet (500 mg total) by mouth 2 (two) times daily with a meal. 30 tablet Vita Currin, DO      PDMP not reviewed this encounter.   Lyndee Hensen, DO 12/12/21 1707

## 2021-12-12 NOTE — Discharge Instructions (Addendum)
Stop by the pharmacy to pick up your prescriptions (Naprosyn for pain and Tizanidine is a muscle relaxer).  Take Tylenol at 1000 mg 3 times a day as needed for additional pain relief.  Consider purchasing some lidocaine patches and apply for 12 hours.  After 12 hours remove patch and reapply once a day.  See handout on cervical strain rehab exercises.  I am concerned you may have injured one of your rotator cuff muscles or tendons. Follow up with [facility] in [place] or [place].

## 2021-12-12 NOTE — ED Triage Notes (Signed)
Patient reports that he has neck pain that runs to his right shoulder -- started about 7 days afo.   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
